# Patient Record
Sex: Male | Born: 1960 | Race: Black or African American | Hispanic: No | Marital: Single | Smoking: Current every day smoker
Health system: Southern US, Community
[De-identification: ages and names within clinical notes are randomized; demographics above are authoritative.]

## PROBLEM LIST (undated history)

## (undated) DIAGNOSIS — I10 Essential (primary) hypertension: Secondary | ICD-10-CM

## (undated) DIAGNOSIS — E119 Type 2 diabetes mellitus without complications: Secondary | ICD-10-CM

## (undated) HISTORY — PX: CERVICAL FUSION: SHX112

---

## 1997-12-30 ENCOUNTER — Emergency Department (HOSPITAL_COMMUNITY): Admission: EM | Admit: 1997-12-30 | Discharge: 1997-12-30 | Payer: Self-pay | Admitting: Emergency Medicine

## 1998-04-13 ENCOUNTER — Emergency Department (HOSPITAL_COMMUNITY): Admission: EM | Admit: 1998-04-13 | Discharge: 1998-04-13 | Payer: Self-pay | Admitting: Emergency Medicine

## 2001-02-22 ENCOUNTER — Emergency Department (HOSPITAL_COMMUNITY): Admission: EM | Admit: 2001-02-22 | Discharge: 2001-02-22 | Payer: Self-pay | Admitting: Emergency Medicine

## 2001-05-25 ENCOUNTER — Emergency Department (HOSPITAL_COMMUNITY): Admission: EM | Admit: 2001-05-25 | Discharge: 2001-05-25 | Payer: Self-pay | Admitting: *Deleted

## 2005-02-09 ENCOUNTER — Emergency Department: Payer: Self-pay | Admitting: Unknown Physician Specialty

## 2006-11-20 ENCOUNTER — Emergency Department: Payer: Self-pay | Admitting: Emergency Medicine

## 2009-03-06 ENCOUNTER — Emergency Department: Payer: Self-pay | Admitting: Emergency Medicine

## 2009-03-30 ENCOUNTER — Emergency Department: Payer: Self-pay | Admitting: Emergency Medicine

## 2010-04-19 ENCOUNTER — Emergency Department: Payer: Self-pay | Admitting: Emergency Medicine

## 2010-07-13 ENCOUNTER — Emergency Department: Payer: Self-pay | Admitting: Emergency Medicine

## 2013-12-14 ENCOUNTER — Emergency Department: Payer: Self-pay | Admitting: Emergency Medicine

## 2013-12-14 LAB — BASIC METABOLIC PANEL
Anion Gap: 6 — ABNORMAL LOW (ref 7–16)
BUN: 10 mg/dL (ref 7–18)
CHLORIDE: 102 mmol/L (ref 98–107)
Calcium, Total: 9.1 mg/dL (ref 8.5–10.1)
Co2: 28 mmol/L (ref 21–32)
Creatinine: 1.23 mg/dL (ref 0.60–1.30)
EGFR (African American): >60
EGFR (Non-African Amer.): >60
Glucose: 183 mg/dL — ABNORMAL HIGH (ref 65–99)
Osmolality: 276 (ref 275–301)
Potassium: 3.6 mmol/L (ref 3.5–5.1)
Sodium: 136 mmol/L (ref 136–145)

## 2013-12-14 LAB — CBC
HCT: 40.6 % (ref 40.0–52.0)
HGB: 13.1 g/dL (ref 13.0–18.0)
MCH: 26.2 pg (ref 26.0–34.0)
MCHC: 32.2 g/dL (ref 32.0–36.0)
MCV: 81 fL (ref 80–100)
PLATELETS: 214 10*3/uL (ref 150–440)
RBC: 4.99 10*6/uL (ref 4.40–5.90)
RDW: 13.5 % (ref 11.5–14.5)
WBC: 6.4 10*3/uL (ref 3.8–10.6)

## 2013-12-14 LAB — CK TOTAL AND CKMB (NOT AT ARMC)
CK, Total: 270 U/L
CK-MB: 2.6 ng/mL (ref 0.5–3.6)

## 2013-12-14 LAB — TROPONIN I

## 2016-09-09 ENCOUNTER — Emergency Department: Payer: No Typology Code available for payment source

## 2016-09-09 ENCOUNTER — Emergency Department
Admission: EM | Admit: 2016-09-09 | Discharge: 2016-09-09 | Disposition: A | Discharge status: Home / Self Care | Payer: No Typology Code available for payment source | Attending: Emergency Medicine | Admitting: Emergency Medicine

## 2016-09-09 ENCOUNTER — Encounter: Payer: Self-pay | Admitting: Emergency Medicine

## 2016-09-09 DIAGNOSIS — Y92410 Unspecified street and highway as the place of occurrence of the external cause: Secondary | ICD-10-CM | POA: Diagnosis not present

## 2016-09-09 DIAGNOSIS — Y999 Unspecified external cause status: Secondary | ICD-10-CM | POA: Diagnosis not present

## 2016-09-09 DIAGNOSIS — E119 Type 2 diabetes mellitus without complications: Secondary | ICD-10-CM | POA: Insufficient documentation

## 2016-09-09 DIAGNOSIS — S199XXA Unspecified injury of neck, initial encounter: Secondary | ICD-10-CM | POA: Diagnosis present

## 2016-09-09 DIAGNOSIS — F17200 Nicotine dependence, unspecified, uncomplicated: Secondary | ICD-10-CM | POA: Diagnosis not present

## 2016-09-09 DIAGNOSIS — Y939 Activity, unspecified: Secondary | ICD-10-CM | POA: Insufficient documentation

## 2016-09-09 DIAGNOSIS — I10 Essential (primary) hypertension: Secondary | ICD-10-CM | POA: Insufficient documentation

## 2016-09-09 DIAGNOSIS — S161XXA Strain of muscle, fascia and tendon at neck level, initial encounter: Secondary | ICD-10-CM | POA: Insufficient documentation

## 2016-09-09 HISTORY — DX: Type 2 diabetes mellitus without complications: E11.9

## 2016-09-09 HISTORY — DX: Essential (primary) hypertension: I10

## 2016-09-09 MED ORDER — ORPHENADRINE CITRATE 30 MG/ML IJ SOLN
60.0000 mg | Freq: Once | INTRAMUSCULAR | Status: AC
  Administered 2016-09-09: 60 mg via INTRAMUSCULAR
  Filled 2016-09-09: qty 2

## 2016-09-09 MED ORDER — KETOROLAC TROMETHAMINE 30 MG/ML IJ SOLN
30.0000 mg | Freq: Once | INTRAMUSCULAR | Status: AC
  Administered 2016-09-09: 30 mg via INTRAMUSCULAR
  Filled 2016-09-09: qty 1

## 2016-09-09 MED ORDER — CYCLOBENZAPRINE HCL 10 MG PO TABS: 10.0000 mg | ORAL_TABLET | Freq: Three times a day (TID) | ORAL | 0 refills | Status: DC | PRN

## 2016-09-09 MED ORDER — MELOXICAM 15 MG PO TABS: 15.0000 mg | ORAL_TABLET | Freq: Every day | ORAL | 0 refills | Status: DC
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
## 2016-11-04 ENCOUNTER — Emergency Department
Admission: EM | Admit: 2016-11-04 | Discharge: 2016-11-04 | Disposition: A | Discharge status: Home / Self Care | Payer: No Typology Code available for payment source | Attending: Emergency Medicine | Admitting: Emergency Medicine

## 2016-11-04 ENCOUNTER — Encounter: Payer: Self-pay | Admitting: Emergency Medicine

## 2016-11-04 DIAGNOSIS — I10 Essential (primary) hypertension: Secondary | ICD-10-CM | POA: Insufficient documentation

## 2016-11-04 DIAGNOSIS — E119 Type 2 diabetes mellitus without complications: Secondary | ICD-10-CM | POA: Diagnosis not present

## 2016-11-04 DIAGNOSIS — F17200 Nicotine dependence, unspecified, uncomplicated: Secondary | ICD-10-CM | POA: Insufficient documentation

## 2016-11-04 DIAGNOSIS — S139XXD Sprain of joints and ligaments of unspecified parts of neck, subsequent encounter: Secondary | ICD-10-CM | POA: Diagnosis not present

## 2016-11-04 DIAGNOSIS — S199XXD Unspecified injury of neck, subsequent encounter: Secondary | ICD-10-CM | POA: Diagnosis present

## 2016-11-04 DIAGNOSIS — S139XXA Sprain of joints and ligaments of unspecified parts of neck, initial encounter: Secondary | ICD-10-CM

## 2016-11-04 DIAGNOSIS — Z79899 Other long term (current) drug therapy: Secondary | ICD-10-CM | POA: Diagnosis not present

## 2016-11-04 MED ORDER — NAPROXEN 500 MG PO TABS
500.0000 mg | ORAL_TABLET | Freq: Once | ORAL | Status: AC
  Administered 2016-11-04: 500 mg via ORAL
  Filled 2016-11-04: qty 1

## 2016-11-04 MED ORDER — NAPROXEN 500 MG PO TABS: 500.0000 mg | ORAL_TABLET | Freq: Two times a day (BID) | ORAL | 0 refills | Status: DC

## 2016-11-04 MED ORDER — CYCLOBENZAPRINE HCL 10 MG PO TABS: 10.0000 mg | ORAL_TABLET | Freq: Three times a day (TID) | ORAL | 0 refills | Status: DC | PRN

## 2016-11-04 MED ORDER — CYCLOBENZAPRINE HCL 10 MG PO TABS
10.0000 mg | ORAL_TABLET | Freq: Once | ORAL | Status: AC
  Administered 2016-11-04: 10 mg via ORAL
  Filled 2016-11-04: qty 1
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
## 2017-01-07 ENCOUNTER — Emergency Department
Admission: EM | Admit: 2017-01-07 | Discharge: 2017-01-08 | Disposition: A | Discharge status: Home / Self Care | Payer: No Typology Code available for payment source | Attending: Emergency Medicine | Admitting: Emergency Medicine

## 2017-01-07 ENCOUNTER — Encounter: Payer: Self-pay | Admitting: Emergency Medicine

## 2017-01-07 DIAGNOSIS — Z79899 Other long term (current) drug therapy: Secondary | ICD-10-CM | POA: Diagnosis not present

## 2017-01-07 DIAGNOSIS — M4802 Spinal stenosis, cervical region: Secondary | ICD-10-CM | POA: Diagnosis not present

## 2017-01-07 DIAGNOSIS — E119 Type 2 diabetes mellitus without complications: Secondary | ICD-10-CM | POA: Diagnosis not present

## 2017-01-07 DIAGNOSIS — M542 Cervicalgia: Secondary | ICD-10-CM | POA: Diagnosis not present

## 2017-01-07 DIAGNOSIS — I10 Essential (primary) hypertension: Secondary | ICD-10-CM | POA: Insufficient documentation

## 2017-01-07 DIAGNOSIS — G8929 Other chronic pain: Secondary | ICD-10-CM

## 2017-01-07 DIAGNOSIS — F17200 Nicotine dependence, unspecified, uncomplicated: Secondary | ICD-10-CM | POA: Diagnosis not present

## 2017-01-07 MED ORDER — HYDROCODONE-ACETAMINOPHEN 5-325 MG PO TABS
1.0000 | ORAL_TABLET | Freq: Once | ORAL | Status: AC
  Administered 2017-01-07: 1 via ORAL
  Filled 2017-01-07: qty 1

## 2017-01-07 MED ORDER — CYCLOBENZAPRINE HCL 5 MG PO TABS: 5.0000 mg | ORAL_TABLET | Freq: Three times a day (TID) | ORAL | 0 refills | Status: DC | PRN

## 2017-01-07 MED ORDER — DICLOFENAC SODIUM 75 MG PO TBEC: 75.0000 mg | DELAYED_RELEASE_TABLET | Freq: Two times a day (BID) | ORAL | 1 refills | Status: DC

## 2017-01-07 MED ORDER — NAPROXEN 500 MG PO TABS
500.0000 mg | ORAL_TABLET | Freq: Once | ORAL | Status: AC
  Administered 2017-01-07: 500 mg via ORAL
  Filled 2017-01-07: qty 1
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
## 2017-01-07 NOTE — ED Provider Notes (Signed)
[facility] Emergency Department Provider Note ____________________________________________  Time seen: 2334  I have reviewed the triage vital signs and the nursing notes.  HISTORY  Chief Complaint  Neck Pain  HPI Arthur Huff is a [age] y.o. male presents to the ED for evaluation of chronic neck pain. [facility] and[facility] have multi-level DDD, osteophytes, and canal stenosis. He is not currently on any medication, and reports neck pain and stiffness, without injury. He denies any distal paresthesias or grip changes. He does note some muscle pain in the left upper trapezius region.   Past Medical History:  Diagnosis Date  . Diabetes mellitus without complication (HCC)   . Hypertension     There are no active problems to display for this patient.  History reviewed. No pertinent surgical history.  Prior to Admission medications   Medication Sig Start Date End Date Taking? Authorizing Provider  cyclobenzaprine (FLEXERIL) 5 MG tablet Take 1 tablet (5 mg total) by mouth 3 (three) times daily as needed for muscle spasms. 01/07/17   Linard Daft, Charlesetta IvoryJenise V Bacon, PA-C  diclofenac (VOLTAREN) 75 MG EC tablet Take 1 tablet (75 mg total) by mouth 2 (two) times daily. 01/07/17   Muscab Brenneman, Charlesetta IvoryJenise V Bacon, PA-C  meloxicam (MOBIC) 15 MG tablet Take 1 tablet (15 mg total) by mouth daily. 09/09/16   Cuthriell, Delorise RoyalsJonathan D, PA-C  naproxen (NAPROSYN) 500 MG tablet Take 1 tablet (500 mg total) by mouth 2 (two) times daily with a meal. 11/04/16   Triplett, Cari B, FNP   Allergies Patient has no known allergies.  No family history on file.  Social History Social History  Substance Use Topics  . Smoking status: Current Every Day Smoker  . Smokeless tobacco: Never Used  . Alcohol use Yes    Review of Systems  Constitutional: Negative for fever. Cardiovascular: Negative for chest pain. Respiratory: Negative for shortness of breath. Musculoskeletal: Negative for back  pain. Reports neck pain as above Skin: Negative for rash. Neurological: Negative for headaches, focal weakness or numbness. ____________________________________________  PHYSICAL EXAM:  VITAL SIGNS: ED Triage Vitals  Enc Vitals Group     BP 01/07/17 2219 (!) 149/86     Pulse Rate 01/07/17 2219 82     Resp 01/07/17 2219 18     Temp 01/07/17 2219 98.4 F (36.9 C)     Temp Source 01/07/17 2219 Oral     SpO2 01/07/17 2219 98 %     Weight 01/07/17 2218 170 lb (77.1 kg)     Height 01/07/17 2218 6\' 2"  (1.88 m)     Head Circumference --      Peak Flow --      Pain Score 01/07/17 2218 7     Pain Loc --      Pain Edu? --      Excl. in GC? --     Constitutional: Alert and oriented. Well appearing and in no distress. Head: Normocephalic and atraumatic. Neck: Supple. No thyromegaly. Normal range without crepitus.  Cardiovascular: Normal rate, regular rhythm. Normal distal pulses. Respiratory: Normal respiratory effort. No wheezes/rales/rhonchi. Musculoskeletal: Nontender with normal range of motion in all extremities.  Neurologic: CN II-XII grossly intact. Normal UE DTRs bilaterally. Normal gait without ataxia. Normal speech and language. No gross focal neurologic deficits are appreciated. Skin:  Skin is warm, dry and intact. No rash noted. ____________________________________________  PROCEDURES  Naproxen 500 mg PO Norco 5-325 mg PO ____________________________________________  INITIAL IMPRESSION / ASSESSMENT AND  PLAN / ED COURSE  Patient with ED evaluation of chronic neck pain due to underlying DDD and spinal stenosis. He is discharged with a prescription for Voltaren as well as Flexeril. He will follow up with his primary care provider at [facility] or [facility] health services for ongoing management. ____________________________________________  FINAL CLINICAL IMPRESSION(S) / ED DIAGNOSES  Final diagnoses:  Chronic neck pain  Spinal stenosis in cervical region      Lissa Hoard, PA-C 01/07/17 2359    Phineas Semen, MD 01/08/17 1534
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
## 2017-01-08 NOTE — ED Notes (Signed)
Patient verbalizes understanding of d/c instructions and follow-up. VS stable and pain controlled per patient.  Patient in NAD at time of d/c and denies further concerns regarding this visit. Patient stable at the time of departure from the unit, departing unit by the safest and most appropriate manner per that patients condition and limitations. Patient advised to return to the ED at any time for emergent concerns, or for new/worsening symptoms.   

## 2017-01-08 NOTE — Discharge Instructions (Signed)
Your exam and previous MRIs shows stenosis and degenerative disc disease. Take the prescription meds as directed. Follow[facility] with [facility] as needed.

## 2017-11-03 ENCOUNTER — Encounter: Payer: Self-pay | Admitting: Emergency Medicine

## 2017-11-03 ENCOUNTER — Other Ambulatory Visit: Payer: Self-pay

## 2017-11-03 ENCOUNTER — Emergency Department
Admission: EM | Admit: 2017-11-03 | Discharge: 2017-11-03 | Disposition: A | Discharge status: Home / Self Care | Payer: No Typology Code available for payment source | Attending: Emergency Medicine | Admitting: Emergency Medicine

## 2017-11-03 DIAGNOSIS — S161XXA Strain of muscle, fascia and tendon at neck level, initial encounter: Secondary | ICD-10-CM | POA: Insufficient documentation

## 2017-11-03 DIAGNOSIS — F17200 Nicotine dependence, unspecified, uncomplicated: Secondary | ICD-10-CM | POA: Insufficient documentation

## 2017-11-03 DIAGNOSIS — Y999 Unspecified external cause status: Secondary | ICD-10-CM | POA: Insufficient documentation

## 2017-11-03 DIAGNOSIS — Y92410 Unspecified street and highway as the place of occurrence of the external cause: Secondary | ICD-10-CM | POA: Insufficient documentation

## 2017-11-03 DIAGNOSIS — E119 Type 2 diabetes mellitus without complications: Secondary | ICD-10-CM | POA: Insufficient documentation

## 2017-11-03 DIAGNOSIS — I10 Essential (primary) hypertension: Secondary | ICD-10-CM | POA: Diagnosis not present

## 2017-11-03 DIAGNOSIS — Z79899 Other long term (current) drug therapy: Secondary | ICD-10-CM | POA: Diagnosis not present

## 2017-11-03 DIAGNOSIS — S199XXA Unspecified injury of neck, initial encounter: Secondary | ICD-10-CM | POA: Diagnosis present

## 2017-11-03 DIAGNOSIS — Y9389 Activity, other specified: Secondary | ICD-10-CM | POA: Diagnosis not present

## 2017-11-03 MED ORDER — MELOXICAM 15 MG PO TABS: 15.0000 mg | ORAL_TABLET | Freq: Every day | ORAL | 2 refills | Status: AC

## 2017-11-03 MED ORDER — BACLOFEN 10 MG PO TABS: 10.0000 mg | ORAL_TABLET | Freq: Every day | ORAL | 1 refills | Status: AC
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
## 2017-11-03 NOTE — Discharge Instructions (Addendum)
Follow-up with your regular doctor if not better in 5 to 7 days.  Or you may follow-up with orthopedics.  Dr. Hyacinth MeekerMiller is on call call his office for an appointment if needed.  Use ice on the areas that hurt.  In 3 days she can switch to wet heat followed by ice.  Be up and active as this will decrease the soreness.  Take medication as prescribed.  Return to emergency department if worsening.
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
## 2021-04-25 ENCOUNTER — Other Ambulatory Visit: Payer: Self-pay

## 2021-04-25 ENCOUNTER — Emergency Department: Payer: No Typology Code available for payment source

## 2021-04-25 ENCOUNTER — Encounter: Payer: Self-pay | Admitting: Emergency Medicine

## 2021-04-25 ENCOUNTER — Emergency Department
Admission: EM | Admit: 2021-04-25 | Discharge: 2021-04-25 | Disposition: A | Discharge status: Home / Self Care | Payer: No Typology Code available for payment source | Attending: Emergency Medicine | Admitting: Emergency Medicine

## 2021-04-25 DIAGNOSIS — I10 Essential (primary) hypertension: Secondary | ICD-10-CM | POA: Insufficient documentation

## 2021-04-25 DIAGNOSIS — Z7984 Long term (current) use of oral hypoglycemic drugs: Secondary | ICD-10-CM | POA: Diagnosis not present

## 2021-04-25 DIAGNOSIS — Y92410 Unspecified street and highway as the place of occurrence of the external cause: Secondary | ICD-10-CM | POA: Insufficient documentation

## 2021-04-25 DIAGNOSIS — E119 Type 2 diabetes mellitus without complications: Secondary | ICD-10-CM | POA: Diagnosis not present

## 2021-04-25 DIAGNOSIS — S161XXA Strain of muscle, fascia and tendon at neck level, initial encounter: Secondary | ICD-10-CM | POA: Insufficient documentation

## 2021-04-25 DIAGNOSIS — M546 Pain in thoracic spine: Secondary | ICD-10-CM | POA: Diagnosis not present

## 2021-04-25 DIAGNOSIS — Z79899 Other long term (current) drug therapy: Secondary | ICD-10-CM | POA: Diagnosis not present

## 2021-04-25 DIAGNOSIS — F17200 Nicotine dependence, unspecified, uncomplicated: Secondary | ICD-10-CM | POA: Insufficient documentation

## 2021-04-25 DIAGNOSIS — S199XXA Unspecified injury of neck, initial encounter: Secondary | ICD-10-CM | POA: Diagnosis present

## 2021-04-25 MED ORDER — KETOROLAC TROMETHAMINE 30 MG/ML IJ SOLN
15.0000 mg | Freq: Once | INTRAMUSCULAR | Status: AC
  Administered 2021-04-25: 15 mg via INTRAMUSCULAR
  Filled 2021-04-25: qty 1

## 2021-04-25 MED ORDER — CYCLOBENZAPRINE HCL 5 MG PO TABS: ORAL_TABLET | ORAL | 0 refills | Status: AC

## 2021-04-25 MED ORDER — NAPROXEN 500 MG PO TABS: 500.0000 mg | ORAL_TABLET | Freq: Two times a day (BID) | ORAL | 0 refills | Status: AC
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
## 2021-04-25 NOTE — Discharge Instructions (Signed)
1.  You may take medicines as needed for pain and muscle spasms (Naprosyn/Flexeril). 2.  Apply moist heat to affected area several times daily. 3.  Return to the ER for worsening symptoms,
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
## 2021-10-06 ENCOUNTER — Emergency Department: Payer: Self-pay

## 2021-10-06 ENCOUNTER — Other Ambulatory Visit: Payer: Self-pay

## 2021-10-06 ENCOUNTER — Emergency Department
Admission: EM | Admit: 2021-10-06 | Discharge: 2021-10-07 | Disposition: A | Discharge status: Home / Self Care | Payer: Self-pay | Attending: Emergency Medicine | Admitting: Emergency Medicine

## 2021-10-06 DIAGNOSIS — Z7984 Long term (current) use of oral hypoglycemic drugs: Secondary | ICD-10-CM | POA: Diagnosis not present

## 2021-10-06 DIAGNOSIS — M542 Cervicalgia: Secondary | ICD-10-CM | POA: Insufficient documentation

## 2021-10-06 DIAGNOSIS — F1490 Cocaine use, unspecified, uncomplicated: Secondary | ICD-10-CM | POA: Diagnosis not present

## 2021-10-06 DIAGNOSIS — I10 Essential (primary) hypertension: Secondary | ICD-10-CM | POA: Diagnosis not present

## 2021-10-06 DIAGNOSIS — M25511 Pain in right shoulder: Secondary | ICD-10-CM | POA: Diagnosis not present

## 2021-10-06 DIAGNOSIS — E1165 Type 2 diabetes mellitus with hyperglycemia: Secondary | ICD-10-CM | POA: Insufficient documentation

## 2021-10-06 DIAGNOSIS — R410 Disorientation, unspecified: Secondary | ICD-10-CM | POA: Insufficient documentation

## 2021-10-06 DIAGNOSIS — R739 Hyperglycemia, unspecified: Secondary | ICD-10-CM

## 2021-10-06 DIAGNOSIS — Z79899 Other long term (current) drug therapy: Secondary | ICD-10-CM | POA: Diagnosis not present

## 2021-10-06 DIAGNOSIS — Y92410 Unspecified street and highway as the place of occurrence of the external cause: Secondary | ICD-10-CM | POA: Diagnosis not present

## 2021-10-06 LAB — CBG MONITORING, ED: Glucose-Capillary: >600 mg/dL (ref 70–99)

## 2021-10-06 MED ORDER — KETOROLAC TROMETHAMINE 30 MG/ML IJ SOLN
30.0000 mg | Freq: Once | INTRAMUSCULAR | Status: AC
  Administered 2021-10-07: 30 mg via INTRAVENOUS
  Filled 2021-10-06: qty 1

## 2021-10-06 MED ORDER — SODIUM CHLORIDE 0.9 % IV BOLUS (SEPSIS)
1000.0000 mL | Freq: Once | INTRAVENOUS | Status: AC
  Administered 2021-10-07: 1000 mL via INTRAVENOUS

## 2021-10-06 NOTE — ED Notes (Signed)
UA SENT TO LAB .  
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
## 2021-10-07 ENCOUNTER — Emergency Department: Payer: Self-pay

## 2021-10-07 LAB — BLOOD GAS, VENOUS
Acid-Base Excess: 3.1 mmol/L — ABNORMAL HIGH (ref 0.0–2.0)
Bicarbonate: 29.5 mmol/L — ABNORMAL HIGH (ref 20.0–28.0)
O2 Saturation: 86.1 %
Patient temperature: 37
pCO2, Ven: 51 mmHg (ref 44–60)
pH, Ven: 7.37 (ref 7.25–7.43)
pO2, Ven: 54 mmHg — ABNORMAL HIGH (ref 32–45)

## 2021-10-07 LAB — COMPREHENSIVE METABOLIC PANEL
ALT: 17 U/L (ref 0–44)
AST: 23 U/L (ref 15–41)
Albumin: 3.5 g/dL (ref 3.5–5.0)
Alkaline Phosphatase: 67 U/L (ref 38–126)
Anion gap: 8 (ref 5–15)
BUN: 17 mg/dL (ref 8–23)
CO2: 28 mmol/L (ref 22–32)
Calcium: 8.8 mg/dL — ABNORMAL LOW (ref 8.9–10.3)
Chloride: 99 mmol/L (ref 98–111)
Creatinine, Ser: 1.5 mg/dL — ABNORMAL HIGH (ref 0.61–1.24)
GFR, Estimated: 53 mL/min — ABNORMAL LOW (ref >60)
Glucose, Bld: 660 mg/dL (ref 70–99)
Potassium: 4 mmol/L (ref 3.5–5.1)
Sodium: 135 mmol/L (ref 135–145)
Total Bilirubin: 0.5 mg/dL (ref 0.3–1.2)
Total Protein: 6.6 g/dL (ref 6.5–8.1)

## 2021-10-07 LAB — URINE DRUG SCREEN, QUALITATIVE (ARMC ONLY)
Amphetamines, Ur Screen: NOT DETECTED
Barbiturates, Ur Screen: NOT DETECTED
Benzodiazepine, Ur Scrn: NOT DETECTED
Cannabinoid 50 Ng, Ur ~~LOC~~: POSITIVE — AB
Cocaine Metabolite,Ur ~~LOC~~: POSITIVE — AB
MDMA (Ecstasy)Ur Screen: NOT DETECTED
Methadone Scn, Ur: NOT DETECTED
Opiate, Ur Screen: NOT DETECTED
Phencyclidine (PCP) Ur S: NOT DETECTED
Tricyclic, Ur Screen: NOT DETECTED

## 2021-10-07 LAB — CBG MONITORING, ED
Glucose-Capillary: 372 mg/dL — ABNORMAL HIGH (ref 70–99)
Glucose-Capillary: 309 mg/dL — ABNORMAL HIGH (ref 70–99)

## 2021-10-07 LAB — CBC WITH DIFFERENTIAL/PLATELET
Abs Immature Granulocytes: 0.01 10*3/uL (ref 0.00–0.07)
Basophils Absolute: 0.1 10*3/uL (ref 0.0–0.1)
Basophils Relative: 1 %
Eosinophils Absolute: 0.2 10*3/uL (ref 0.0–0.5)
Eosinophils Relative: 3 %
HCT: 31.9 % — ABNORMAL LOW (ref 39.0–52.0)
Hemoglobin: 10.2 g/dL — ABNORMAL LOW (ref 13.0–17.0)
Immature Granulocytes: 0 %
Lymphocytes Relative: 26 %
Lymphs Abs: 1.6 10*3/uL (ref 0.7–4.0)
MCH: 25.4 pg — ABNORMAL LOW (ref 26.0–34.0)
MCHC: 32 g/dL (ref 30.0–36.0)
MCV: 79.6 fL — ABNORMAL LOW (ref 80.0–100.0)
Monocytes Absolute: 0.4 10*3/uL (ref 0.1–1.0)
Monocytes Relative: 7 %
Neutro Abs: 3.9 10*3/uL (ref 1.7–7.7)
Neutrophils Relative %: 63 %
Platelets: 218 10*3/uL (ref 150–400)
RBC: 4.01 MIL/uL — ABNORMAL LOW (ref 4.22–5.81)
RDW: 12.1 % (ref 11.5–15.5)
WBC: 6.2 10*3/uL (ref 4.0–10.5)
nRBC: 0 % (ref 0.0–0.2)

## 2021-10-07 LAB — URINALYSIS, ROUTINE W REFLEX MICROSCOPIC
Bacteria, UA: NONE SEEN
Bilirubin Urine: NEGATIVE
Glucose, UA: >=500 mg/dL — AB
Hgb urine dipstick: NEGATIVE
Ketones, ur: NEGATIVE mg/dL
Leukocytes,Ua: NEGATIVE
Nitrite: NEGATIVE
Protein, ur: NEGATIVE mg/dL
Specific Gravity, Urine: 1.025 (ref 1.005–1.030)
Squamous Epithelial / LPF: NONE SEEN (ref 0–5)
WBC, UA: NONE SEEN WBC/hpf (ref 0–5)
pH: 7 (ref 5.0–8.0)

## 2021-10-07 LAB — ETHANOL: Alcohol, Ethyl (B): <10 mg/dL (ref <10)

## 2021-10-07 MED ORDER — SODIUM CHLORIDE 0.9 % IV BOLUS (SEPSIS)
1000.0000 mL | Freq: Once | INTRAVENOUS | Status: AC
  Administered 2021-10-07: 1000 mL via INTRAVENOUS

## 2021-10-07 MED ORDER — KETOROLAC TROMETHAMINE 30 MG/ML IJ SOLN
30.0000 mg | Freq: Once | INTRAMUSCULAR | Status: AC
  Administered 2021-10-07: 30 mg via INTRAVENOUS
  Filled 2021-10-07: qty 1

## 2021-10-07 MED ORDER — INSULIN ASPART 100 UNIT/ML IJ SOLN
10.0000 [IU] | Freq: Once | INTRAMUSCULAR | Status: AC
  Administered 2021-10-07: 10 [IU] via INTRAVENOUS
  Filled 2021-10-07: qty 1

## 2021-10-07 MED ORDER — INSULIN ASPART 100 UNIT/ML IJ SOLN
5.0000 [IU] | Freq: Once | INTRAMUSCULAR | Status: AC
  Administered 2021-10-07: 5 [IU] via INTRAVENOUS
  Filled 2021-10-07: qty 1

## 2021-10-07 NOTE — ED Notes (Signed)
This tech attempted to provide pt with a new dry linen change. Pt refused, pt stated, "No I don't want you to change them. I'm comfortable right now." CBG obtained, and urinal emptied. Romelle Starcher, RN aware.  ?

## 2022-12-25 ENCOUNTER — Emergency Department
Admission: EM | Admit: 2022-12-25 | Discharge: 2022-12-25 | Disposition: A | Discharge status: Home / Self Care | Payer: Medicaid Other | Attending: Emergency Medicine | Admitting: Emergency Medicine

## 2022-12-25 ENCOUNTER — Other Ambulatory Visit: Payer: Self-pay

## 2022-12-25 DIAGNOSIS — E119 Type 2 diabetes mellitus without complications: Secondary | ICD-10-CM | POA: Insufficient documentation

## 2022-12-25 DIAGNOSIS — R0789 Other chest pain: Secondary | ICD-10-CM | POA: Insufficient documentation

## 2022-12-25 DIAGNOSIS — R000 Tachycardia, unspecified: Secondary | ICD-10-CM | POA: Insufficient documentation

## 2022-12-25 DIAGNOSIS — R079 Chest pain, unspecified: Secondary | ICD-10-CM

## 2022-12-25 DIAGNOSIS — I10 Essential (primary) hypertension: Secondary | ICD-10-CM | POA: Insufficient documentation

## 2022-12-25 LAB — CBC
HCT: 36.6 % — ABNORMAL LOW (ref 39.0–52.0)
Hemoglobin: 11.8 g/dL — ABNORMAL LOW (ref 13.0–17.0)
MCH: 24.6 pg — ABNORMAL LOW (ref 26.0–34.0)
MCHC: 32.2 g/dL (ref 30.0–36.0)
MCV: 76.4 fL — ABNORMAL LOW (ref 80.0–100.0)
Platelets: 221 10*3/uL (ref 150–400)
RBC: 4.79 MIL/uL (ref 4.22–5.81)
RDW: 15.3 % (ref 11.5–15.5)
WBC: 6.5 10*3/uL (ref 4.0–10.5)
nRBC: 0 % (ref 0.0–0.2)

## 2022-12-25 LAB — BASIC METABOLIC PANEL
Anion gap: 11 (ref 5–15)
BUN: 17 mg/dL (ref 8–23)
CO2: 25 mmol/L (ref 22–32)
Calcium: 9.1 mg/dL (ref 8.9–10.3)
Chloride: 102 mmol/L (ref 98–111)
Creatinine, Ser: 0.93 mg/dL (ref 0.61–1.24)
GFR, Estimated: >60 mL/min (ref >60)
Glucose, Bld: 98 mg/dL (ref 70–99)
Potassium: 3.3 mmol/L — ABNORMAL LOW (ref 3.5–5.1)
Sodium: 138 mmol/L (ref 135–145)

## 2022-12-25 LAB — TROPONIN I (HIGH SENSITIVITY): Troponin I (High Sensitivity): 3 ng/L (ref <18)

## 2022-12-25 NOTE — ED Provider Notes (Signed)
[facility] Provider Note    Event Date/Time   First MD Initiated Contact with Patient 12/25/22 1104     (approximate)  History   Chief Complaint: No chief complaint on file.  HPI  Arthur Huff is a 62 y.o. male with a past medical history of diabetes, hypertension, presents to the emergency department for chest pain and not feeling well.  According to the patient he has been experiencing increasing shoulder pain which is a chronic condition per patient he is scheduled to have a surgery coming up.  He states a friend of his gave him a pill that he was told would help with the pain.  He took a pill this morning and shortly afterwards began feeling unwell.  Patient states he was feeling somewhat lightheaded and was having some pain in his chest.  Patient states he was lifting firewood when the pain started so he is not sure if it was the muscles or his heart.  States he went inside and checked his blood pressure and it was elevated he was worried so he came to the emergency department for evaluation.  Since arriving to the emergency department however the patient states he is feeling back to normal feels like the medicine is worn off.  Physical Exam   Triage Vital Signs: ED Triage Vitals  Encounter Vitals Group     BP 12/25/22 0944 (!) 147/121     Systolic BP Percentile --      Diastolic BP Percentile --      Pulse Rate 12/25/22 0944 (!) 111     Resp 12/25/22 0944 20     Temp 12/25/22 0944 97.6 F (36.4 C)     Temp src --      SpO2 12/25/22 0944 98 %     Weight 12/25/22 0945 148 lb (67.1 kg)     Height 12/25/22 0945 6\' 1"  (1.854 m)     Head Circumference --      Peak Flow --      Pain Score 12/25/22 0945 5     Pain Loc --      Pain Education --      Exclude from Growth Chart --     Most recent vital signs: Vitals:   12/25/22 0944  BP: (!) 147/121  Pulse: (!) 111  Resp: 20  Temp: 97.6 F (36.4 C)  SpO2: 98%    General: Awake, no distress.   CV:  Good peripheral perfusion.  Regular rate and rhythm  Resp:  Normal effort.  Equal breath sounds bilaterally.  Mild left chest wall tenderness to palpation over the pectoralis muscle. Abd:  No distention.  Soft, nontender.  No rebound or guarding.   ED Results / Procedures / Treatments   EKG  EKG viewed and interpreted by myself shows sinus tachycardia at 106 bpm with a narrow QRS, normal axis, normal intervals, no concerning ST changes  MEDICATIONS ORDERED IN ED: Medications - No data to display   IMPRESSION / MDM / ASSESSMENT AND PLAN / ED COURSE  I reviewed the triage vital signs and the nursing notes.  Patient's presentation is most consistent with acute presentation with potential threat to life or bodily function.  Patient presents to the emergency department for chest pain not feeling well, high blood pressure after taking an unknown pill.  Patient states he took this pill around 8:00 or so this morning and within 30 minutes began feeling unwell.  He does not know what  the pill was he was just told by his friend that it would help the pain.  Patient states his big concern was checking of blood pressure at home and it was elevated so he came to the emergency department for evaluation.  Overall the patient appears well, no distress does have mild chest wall tenderness on the left side but the patient states he has been lifting wood all day.  Patient's chemistry is reassuring, CBC is reassuring and the patient's troponin is negative.  EKG shows no concerning findings.  Patient states he is feeling back to normal now he believes the medication is worn off.  Given the patient's reassuring physical exam and reassuring workup I believe the patient would safe for discharge home with PCP follow-up.  Patient agreeable to plan of care.  FINAL CLINICAL IMPRESSION(S) / ED DIAGNOSES   Chest pain Chest wall pain  Note:  This document was prepared using Dragon voice recognition software and  may include unintentional dictation errors.   Minna Antis, MD 12/25/22 1120

## 2022-12-25 NOTE — ED Triage Notes (Signed)
Pt to ED [facility] for evaluation. Took unknown pill for shoulder pain (having surgery soon). States feels funny in chest, hypertensive. No hx of HTN.  Takes percocet with pain clinic

## 2022-12-25 NOTE — ED Notes (Signed)
Pt. Brought cordless phone.

## 2023-01-24 ENCOUNTER — Other Ambulatory Visit: Payer: Self-pay

## 2023-01-24 ENCOUNTER — Emergency Department
Admission: EM | Admit: 2023-01-24 | Discharge: 2023-01-24 | Disposition: A | Discharge status: Home / Self Care | Payer: Medicaid Other | Attending: Emergency Medicine | Admitting: Emergency Medicine

## 2023-01-24 DIAGNOSIS — E119 Type 2 diabetes mellitus without complications: Secondary | ICD-10-CM | POA: Insufficient documentation

## 2023-01-24 DIAGNOSIS — I10 Essential (primary) hypertension: Secondary | ICD-10-CM | POA: Insufficient documentation

## 2023-01-24 DIAGNOSIS — R000 Tachycardia, unspecified: Secondary | ICD-10-CM | POA: Insufficient documentation

## 2023-01-24 DIAGNOSIS — R251 Tremor, unspecified: Secondary | ICD-10-CM | POA: Diagnosis not present

## 2023-01-24 DIAGNOSIS — E860 Dehydration: Secondary | ICD-10-CM | POA: Diagnosis not present

## 2023-01-24 LAB — CBC WITH DIFFERENTIAL/PLATELET
Abs Immature Granulocytes: 0.04 10*3/uL (ref 0.00–0.07)
Basophils Absolute: 0.1 10*3/uL (ref 0.0–0.1)
Basophils Relative: 1 %
Eosinophils Absolute: 0.1 10*3/uL (ref 0.0–0.5)
Eosinophils Relative: 0 %
HCT: 38.5 % — ABNORMAL LOW (ref 39.0–52.0)
Hemoglobin: 12.1 g/dL — ABNORMAL LOW (ref 13.0–17.0)
Immature Granulocytes: 0 %
Lymphocytes Relative: 11 %
Lymphs Abs: 1.5 10*3/uL (ref 0.7–4.0)
MCH: 24.7 pg — ABNORMAL LOW (ref 26.0–34.0)
MCHC: 31.4 g/dL (ref 30.0–36.0)
MCV: 78.6 fL — ABNORMAL LOW (ref 80.0–100.0)
Monocytes Absolute: 0.7 10*3/uL (ref 0.1–1.0)
Monocytes Relative: 5 %
Neutro Abs: 11.1 10*3/uL — ABNORMAL HIGH (ref 1.7–7.7)
Neutrophils Relative %: 83 %
Platelets: 415 10*3/uL — ABNORMAL HIGH (ref 150–400)
RBC: 4.9 MIL/uL (ref 4.22–5.81)
RDW: 14.2 % (ref 11.5–15.5)
WBC: 13.5 10*3/uL — ABNORMAL HIGH (ref 4.0–10.5)
nRBC: 0 % (ref 0.0–0.2)

## 2023-01-24 LAB — COMPREHENSIVE METABOLIC PANEL WITH GFR
ALT: 12 U/L (ref 0–44)
AST: 20 U/L (ref 15–41)
Albumin: 4.3 g/dL (ref 3.5–5.0)
Alkaline Phosphatase: 63 U/L (ref 38–126)
Anion gap: 12 (ref 5–15)
BUN: 16 mg/dL (ref 8–23)
CO2: 25 mmol/L (ref 22–32)
Calcium: 8.9 mg/dL (ref 8.9–10.3)
Chloride: 96 mmol/L — ABNORMAL LOW (ref 98–111)
Creatinine, Ser: 0.98 mg/dL (ref 0.61–1.24)
GFR, Estimated: >60 mL/min
Glucose, Bld: 151 mg/dL — ABNORMAL HIGH (ref 70–99)
Potassium: 3.5 mmol/L (ref 3.5–5.1)
Sodium: 133 mmol/L — ABNORMAL LOW (ref 135–145)
Total Bilirubin: 0.1 mg/dL — ABNORMAL LOW (ref 0.3–1.2)
Total Protein: 9.2 g/dL — ABNORMAL HIGH (ref 6.5–8.1)

## 2023-01-24 LAB — CBG MONITORING, ED: Glucose-Capillary: 152 mg/dL — ABNORMAL HIGH (ref 70–99)

## 2023-01-24 MED ORDER — SODIUM CHLORIDE 0.9 % IV BOLUS
1000.0000 mL | Freq: Once | INTRAVENOUS | Status: AC
  Administered 2023-01-24: 1000 mL via INTRAVENOUS

## 2023-01-24 NOTE — ED Triage Notes (Signed)
Arrives from home via [facility].  Recent ?neck surgery. Today, c/o hand tremors.  EMS notes HTN.  HTN had been uncercontrol without meds x 5 years.    Patient is AAOx3.  Skin warm and dry. NAD. No trmors appreciated by EMS.  P:  117 BP: 194/125 CBG:  178

## 2023-01-24 NOTE — ED Provider Notes (Signed)
[facility] Provider Note    Event Date/Time   First MD Initiated Contact with Patient 01/24/23 1543     (approximate)   History   Hypertension   HPI  Arthur Huff is a 62 y.o. male with a history of diabetes and hypertension who presents with arm tremor and concern for elevated blood pressure.  The patient states that he had neck surgery last week.  He has been recovering well.  He started to have some increased pain in the neck today and took some BC powder which he states helped.  He is no longer having any pain currently.  However he started to have some tremor in his arms bilaterally.  He thought it may have been due to his nerves.  He states that he has no numbness or tingling in the arms and no weakness.  He denies any acute or worsening pain in the neck.  He states that the wound to his neck is healing well.  He has no chest pain or difficulty breathing.  He denies any palpitations.  However he states he thinks he may be dehydrated.  He states that after the surgery he has not really been drinking enough liquids.  I reviewed the past medical records.  The patient was seen in the ED on 7/31 with chest pain and not feeling well after taking an unknown medication.  Workup was negative at that time.  He subsequently had an admission at [facility] for cervical myelopathy between 8/19 and 8/22 during which he had a C3-6 discectomy and fusion.    Physical Exam   Triage Vital Signs: ED Triage Vitals  Encounter Vitals Group     BP 01/24/23 1447 (!) 161/111     Systolic BP Percentile --      Diastolic BP Percentile --      Pulse Rate 01/24/23 1447 (!) 122     Resp 01/24/23 1447 17     Temp 01/24/23 1447 98 F (36.7 C)     Temp Source 01/24/23 1447 Oral     SpO2 01/24/23 1447 100 %     Weight 01/24/23 1448 147 lb 11.3 oz (67 kg)     Height 01/24/23 1448 6\' 1"  (1.854 m)     Head Circumference --      Peak Flow --      Pain Score 01/24/23 1448 0     Pain Loc --       Pain Education --      Exclude from Growth Chart --     Most recent vital signs: Vitals:   01/24/23 1556 01/24/23 1630  BP: (!) 171/116 (!) 140/84  Pulse: (!) 120 90  Resp: 18   Temp:    SpO2: 100% 100%     General: Alert, well-appearing, no distress.  CV:  Good peripheral perfusion.  Tachycardic, otherwise normal heart sounds. Resp:  Normal effort.  Lungs CTAB. Abd:  No distention.  Other:  5/5 motor strength and intact sensation to bilateral upper extremities.  Anterior and posterior cervical surgical scars clean/dry/intact, healing well.   ED Results / Procedures / Treatments   Labs (all labs ordered are listed, but only abnormal results are displayed) Labs Reviewed  CBC WITH DIFFERENTIAL/PLATELET - Abnormal; Notable for the following components:      Result Value   WBC 13.5 (*)    Hemoglobin 12.1 (*)    HCT 38.5 (*)    MCV 78.6 (*)    MCH 24.7 (*)  Platelets 415 (*)    Neutro Abs 11.1 (*)    All other components within normal limits  COMPREHENSIVE METABOLIC PANEL - Abnormal; Notable for the following components:   Sodium 133 (*)    Chloride 96 (*)    Glucose, Bld 151 (*)    Total Protein 9.2 (*)    Total Bilirubin 0.1 (*)    All other components within normal limits  CBG MONITORING, ED - Abnormal; Notable for the following components:   Glucose-Capillary 152 (*)    All other components within normal limits     EKG  ED ECG REPORT I, Dionne Bucy, the attending physician, personally viewed and interpreted this ECG.  Date: 01/24/2023 EKG Time: 1618 Rate: 94 Rhythm: normal sinus rhythm QRS Axis: normal Intervals: normal ST/T Wave abnormalities: normal Narrative Interpretation: no evidence of acute ischemia    RADIOLOGY     PROCEDURES:  Critical Care performed: No  Procedures   MEDICATIONS ORDERED IN ED: Medications  sodium chloride 0.9 % bolus 1,000 mL (0 mLs Intravenous Stopped 01/24/23 1800)     IMPRESSION / MDM /  ASSESSMENT AND PLAN / ED COURSE  I reviewed the triage vital signs and the nursing notes.  62 year old male with PMH as noted above and status post recent C-spine surgery presents primarily with a complaint of arm tremor that has now resolved.  In addition he has noted elevated blood pressure although has no symptoms associated with this.  He also feels he may be dehydrated.  On exam he is overall well-appearing.  He is tachycardic and hypertensive with otherwise normal vital signs.  His upper extremity neuro exam is normal and he does not demonstrate any tremor or other acute findings currently.  Surgical sites are healing well with no evidence of infection.  Differential diagnosis includes, but is not limited to, muscle spasm, cramping due to dehydration, electrolyte abnormality, radicular pain.  However, the patient has no acute or worsening pain or any evidence of other complication after the surgery.  Initial lab workup reveals mild leukocytosis, although the CBC overall appears hemoconcentrated.  Electrolytes are normal.  There is no evidence of rhabdomyolysis.  He has no AKI.  There is no evidence of acute infection or sepsis.  Patient's presentation is most consistent with acute complicated illness / injury requiring diagnostic workup.  We will give a fluid bolus and reassess.  If the blood pressure remains high he may need treatment for this.  ----------------------------------------- 6:45 PM on 01/24/2023 -----------------------------------------  Heart rate and blood pressure normalized.  The patient continues to be asymptomatic.  He is stable for discharge home.  I counseled him on the results of the workup.  I gave strict return precautions and he expresses understanding.   FINAL CLINICAL IMPRESSION(S) / ED DIAGNOSES   Final diagnoses:  Muscle tremor  Dehydration     Rx / DC Orders   ED Discharge Orders     None        Note:  This document was prepared using Dragon  voice recognition software and may include unintentional dictation errors.    Dionne Bucy, MD 01/24/23 [phone]

## 2023-01-24 NOTE — ED Triage Notes (Signed)
Pt presents to ED with c/o hypertension and states he has not taken meds for the that in the past 5 years, pt states "I got away from the stress".   Pt states recent neck surgery.

## 2023-01-24 NOTE — Discharge Instructions (Addendum)
Follow up with your primary care provider and surgeon as scheduled.  Return to the ER for new, worsening, or persistent severe tremor, weakness or numbness, pain, lightheadedness, elevated blood pressure readings, or any other new or worsening symptoms that concern you.

## 2023-03-21 ENCOUNTER — Other Ambulatory Visit: Payer: Self-pay

## 2023-03-21 ENCOUNTER — Emergency Department
Admission: EM | Admit: 2023-03-21 | Discharge: 2023-03-21 | Disposition: A | Discharge status: Home / Self Care | Payer: Medicaid Other | Attending: Emergency Medicine | Admitting: Emergency Medicine

## 2023-03-21 DIAGNOSIS — R1033 Periumbilical pain: Secondary | ICD-10-CM | POA: Insufficient documentation

## 2023-03-21 DIAGNOSIS — R112 Nausea with vomiting, unspecified: Secondary | ICD-10-CM | POA: Insufficient documentation

## 2023-03-21 DIAGNOSIS — I10 Essential (primary) hypertension: Secondary | ICD-10-CM | POA: Insufficient documentation

## 2023-03-21 DIAGNOSIS — E119 Type 2 diabetes mellitus without complications: Secondary | ICD-10-CM | POA: Diagnosis not present

## 2023-03-21 DIAGNOSIS — R1030 Lower abdominal pain, unspecified: Secondary | ICD-10-CM

## 2023-03-21 DIAGNOSIS — R1084 Generalized abdominal pain: Secondary | ICD-10-CM | POA: Diagnosis present

## 2023-03-21 LAB — COMPREHENSIVE METABOLIC PANEL
ALT: 13 U/L (ref 0–44)
AST: 20 U/L (ref 15–41)
Albumin: 4.5 g/dL (ref 3.5–5.0)
Alkaline Phosphatase: 60 U/L (ref 38–126)
Anion gap: 10 (ref 5–15)
BUN: 17 mg/dL (ref 8–23)
CO2: 26 mmol/L (ref 22–32)
Calcium: 9.6 mg/dL (ref 8.9–10.3)
Chloride: 103 mmol/L (ref 98–111)
Creatinine, Ser: 1.05 mg/dL (ref 0.61–1.24)
GFR, Estimated: >60 mL/min (ref >60)
Glucose, Bld: 168 mg/dL — ABNORMAL HIGH (ref 70–99)
Potassium: 4.5 mmol/L (ref 3.5–5.1)
Sodium: 139 mmol/L (ref 135–145)
Total Bilirubin: 0.8 mg/dL (ref 0.3–1.2)
Total Protein: 8.3 g/dL — ABNORMAL HIGH (ref 6.5–8.1)

## 2023-03-21 LAB — CBC
HCT: 39 % (ref 39.0–52.0)
Hemoglobin: 12.3 g/dL — ABNORMAL LOW (ref 13.0–17.0)
MCH: 24.6 pg — ABNORMAL LOW (ref 26.0–34.0)
MCHC: 31.5 g/dL (ref 30.0–36.0)
MCV: 77.8 fL — ABNORMAL LOW (ref 80.0–100.0)
Platelets: 248 10*3/uL (ref 150–400)
RBC: 5.01 MIL/uL (ref 4.22–5.81)
RDW: 13.6 % (ref 11.5–15.5)
WBC: 9.4 10*3/uL (ref 4.0–10.5)
nRBC: 0 % (ref 0.0–0.2)

## 2023-03-21 LAB — URINALYSIS, ROUTINE W REFLEX MICROSCOPIC
Bacteria, UA: NONE SEEN
Bilirubin Urine: NEGATIVE
Glucose, UA: >=500 mg/dL — AB
Hgb urine dipstick: NEGATIVE
Ketones, ur: 20 mg/dL — AB
Leukocytes,Ua: NEGATIVE
Nitrite: NEGATIVE
Protein, ur: NEGATIVE mg/dL
RBC / HPF: 0 RBC/hpf (ref 0–5)
Specific Gravity, Urine: 1.022 (ref 1.005–1.030)
Squamous Epithelial / HPF: 0 /[HPF] (ref 0–5)
pH: 7 (ref 5.0–8.0)

## 2023-03-21 LAB — LIPASE, BLOOD: Lipase: 21 U/L (ref 11–51)

## 2023-03-21 MED ORDER — ONDANSETRON HCL 4 MG/2ML IJ SOLN
4.0000 mg | Freq: Once | INTRAMUSCULAR | Status: AC | PRN
  Administered 2023-03-21: 4 mg via INTRAVENOUS
  Filled 2023-03-21: qty 2

## 2023-03-21 MED ORDER — ONDANSETRON 8 MG PO TBDP: 8.0000 mg | ORAL_TABLET | Freq: Three times a day (TID) | ORAL | 0 refills | Status: AC | PRN

## 2023-03-21 MED ORDER — ACETAMINOPHEN 500 MG PO TABS
1000.0000 mg | ORAL_TABLET | Freq: Once | ORAL | Status: AC
  Administered 2023-03-21: 1000 mg via ORAL
  Filled 2023-03-21: qty 2

## 2023-03-21 MED ORDER — ONDANSETRON HCL 4 MG/2ML IJ SOLN
4.0000 mg | Freq: Once | INTRAMUSCULAR | Status: AC
  Administered 2023-03-21: 4 mg via INTRAVENOUS
  Filled 2023-03-21: qty 2

## 2023-03-21 NOTE — ED Notes (Signed)
This RN assisted patient to use toilet and collect urine sample.

## 2023-03-21 NOTE — ED Notes (Signed)
Pt provided discharge instructions and prescription information. Pt was given the opportunity to ask questions and questions were answered.   

## 2023-03-21 NOTE — Discharge Instructions (Addendum)
Please use ibuprofen (Motrin) up to 800 mg every 8 hours, naproxen (Naprosyn) up to 500 mg every 12 hours, and/or acetaminophen (Tylenol) up to 4 g/day for any continued pain.  Please do not use this medication regimen for longer than 7 days

## 2023-03-21 NOTE — ED Triage Notes (Signed)
Pt here with generalized abd pain since this morning. Pt states pain in severe and he endorses NV. Pt states vomit is green in color.

## 2023-03-21 NOTE — ED Provider Notes (Signed)
[facility] Provider Note   Event Date/Time   First MD Initiated Contact with Patient 03/21/23 1405     (approximate) History  Abdominal Pain  HPI Arthur Huff is a 62 y.o. male with a past medical history of hypertension and type 2 diabetes who presents complaining of generalized abdominal pain since this morning.  Patient states that this pain is in bilateral lower quadrants and 10/10 in severity.  Patient also endorses nausea/vomiting without diarrhea.  Patient denies any recent travel or sick contacts ROS: Patient currently denies any vision changes, tinnitus, difficulty speaking, facial droop, sore throat, chest pain, shortness of breath, diarrhea, dysuria, or weakness/numbness/paresthesias in any extremity   Physical Exam  Triage Vital Signs: ED Triage Vitals [03/21/23 1334]  Encounter Vitals Group     BP (!) 147/82     Systolic BP Percentile      Diastolic BP Percentile      Pulse Rate 67     Resp 18     Temp 98.2 F (36.8 C)     Temp Source Oral     SpO2 100 %     Weight 147 lb 11.3 oz (67 kg)     Height 6\' 1"  (1.854 m)     Head Circumference      Peak Flow      Pain Score 10     Pain Loc      Pain Education      Exclude from Growth Chart    Most recent vital signs: Vitals:   03/21/23 1334  BP: (!) 147/82  Pulse: 67  Resp: 18  Temp: 98.2 F (36.8 C)  SpO2: 100%   General: Awake, oriented x4. CV:  Good peripheral perfusion.  Resp:  Normal effort.  Abd:  No distention.  Periumbilical tenderness to palpation Other:  Middle-aged well-developed, well-nourished African-American male resting comfortably in no acute distress ED Results / Procedures / Treatments  Labs (all labs ordered are listed, but only abnormal results are displayed) Labs Reviewed  COMPREHENSIVE METABOLIC PANEL - Abnormal; Notable for the following components:      Result Value   Glucose, Bld 168 (*)    Total Protein 8.3 (*)    All other components within normal  limits  CBC - Abnormal; Notable for the following components:   Hemoglobin 12.3 (*)    MCV 77.8 (*)    MCH 24.6 (*)    All other components within normal limits  LIPASE, BLOOD  URINALYSIS, ROUTINE W REFLEX MICROSCOPIC   EKG ED ECG REPORT I, Merwyn Katos, the attending physician, personally viewed and interpreted this ECG. Date: 03/21/2023 EKG Time: 1342 Rate: 74 Rhythm: normal sinus rhythm QRS Axis: normal Intervals: normal ST/T Wave abnormalities: normal Narrative Interpretation: no evidence of acute ischemia PROCEDURES: Critical Care performed: No .1-3 Lead EKG Interpretation  Performed by: Merwyn Katos, MD Authorized by: Merwyn Katos, MD     Interpretation: normal     ECG rate:  71   ECG rate assessment: normal     Rhythm: sinus rhythm     Ectopy: none     Conduction: normal    MEDICATIONS ORDERED IN ED: Medications  ondansetron (ZOFRAN) injection 4 mg (4 mg Intravenous Given 03/21/23 1341)  ondansetron (ZOFRAN) injection 4 mg (4 mg Intravenous Given 03/21/23 1420)   IMPRESSION / MDM / ASSESSMENT AND PLAN / ED COURSE  I reviewed the triage vital signs and the nursing notes.  The patient is on the cardiac monitor to evaluate for evidence of arrhythmia and/or significant heart rate changes. Patient's presentation is most consistent with acute presentation with potential threat to life or bodily function. Patient presents for abdominal pain.  Differential diagnosis includes appendicitis, abdominal aortic aneurysm, surgical biliary disease, pancreatitis, SBO, mesenteric ischemia, serious intra-abdominal bacterial illness, genital torsion. Doubt atypical ACS. Based on history, physical exam, radiologic/laboratory evaluation, there is no red flag results or symptomatology requiring emergent intervention or need for admission at this time Pt tolerating PO. Disposition: Patient will be discharged with strict return precautions and follow  up with primary MD within 12-24 hours for further evaluation. Patient understands that this still may have an early presentation of an emergent medical condition such as appendicitis that will require a recheck.    FINAL CLINICAL IMPRESSION(S) / ED DIAGNOSES   Final diagnoses:  Lower abdominal pain  Nausea and vomiting, unspecified vomiting type   Rx / DC Orders   ED Discharge Orders          Ordered    ondansetron (ZOFRAN-ODT) 8 MG disintegrating tablet  Every 8 hours PRN        03/21/23 1504           Note:  This document was prepared using Dragon voice recognition software and may include unintentional dictation errors.   Merwyn Katos, MD 03/21/23 [phone]

## 2023-03-24 ENCOUNTER — Emergency Department
Admission: EM | Admit: 2023-03-24 | Discharge: 2023-03-24 | Disposition: A | Discharge status: Home / Self Care | Payer: Medicaid Other | Attending: Emergency Medicine | Admitting: Emergency Medicine

## 2023-03-24 ENCOUNTER — Emergency Department: Payer: Medicaid Other

## 2023-03-24 ENCOUNTER — Ambulatory Visit
Admission: EM | Admit: 2023-03-24 | Discharge: 2023-03-24 | Disposition: A | Discharge status: Home / Self Care | Payer: Medicaid Other | Attending: Emergency Medicine | Admitting: Emergency Medicine

## 2023-03-24 ENCOUNTER — Other Ambulatory Visit: Payer: Self-pay

## 2023-03-24 DIAGNOSIS — R112 Nausea with vomiting, unspecified: Secondary | ICD-10-CM

## 2023-03-24 DIAGNOSIS — E1165 Type 2 diabetes mellitus with hyperglycemia: Secondary | ICD-10-CM | POA: Diagnosis not present

## 2023-03-24 DIAGNOSIS — R1010 Upper abdominal pain, unspecified: Secondary | ICD-10-CM

## 2023-03-24 DIAGNOSIS — R1013 Epigastric pain: Secondary | ICD-10-CM | POA: Insufficient documentation

## 2023-03-24 DIAGNOSIS — I10 Essential (primary) hypertension: Secondary | ICD-10-CM

## 2023-03-24 DIAGNOSIS — E119 Type 2 diabetes mellitus without complications: Secondary | ICD-10-CM | POA: Insufficient documentation

## 2023-03-24 DIAGNOSIS — M25511 Pain in right shoulder: Secondary | ICD-10-CM

## 2023-03-24 DIAGNOSIS — R1030 Lower abdominal pain, unspecified: Secondary | ICD-10-CM | POA: Diagnosis not present

## 2023-03-24 LAB — CBC
HCT: 40.2 % (ref 39.0–52.0)
Hemoglobin: 13.1 g/dL (ref 13.0–17.0)
MCH: 24.7 pg — ABNORMAL LOW (ref 26.0–34.0)
MCHC: 32.6 g/dL (ref 30.0–36.0)
MCV: 75.7 fL — ABNORMAL LOW (ref 80.0–100.0)
Platelets: 246 10*3/uL (ref 150–400)
RBC: 5.31 MIL/uL (ref 4.22–5.81)
RDW: 13 % (ref 11.5–15.5)
WBC: 7.2 10*3/uL (ref 4.0–10.5)
nRBC: 0 % (ref 0.0–0.2)

## 2023-03-24 LAB — COMPREHENSIVE METABOLIC PANEL
ALT: 18 U/L (ref 0–44)
AST: 23 U/L (ref 15–41)
Albumin: 4.3 g/dL (ref 3.5–5.0)
Alkaline Phosphatase: 61 U/L (ref 38–126)
Anion gap: 11 (ref 5–15)
BUN: 15 mg/dL (ref 8–23)
CO2: 30 mmol/L (ref 22–32)
Calcium: 9.2 mg/dL (ref 8.9–10.3)
Chloride: 92 mmol/L — ABNORMAL LOW (ref 98–111)
Creatinine, Ser: 0.97 mg/dL (ref 0.61–1.24)
GFR, Estimated: >60 mL/min (ref >60)
Glucose, Bld: 178 mg/dL — ABNORMAL HIGH (ref 70–99)
Potassium: 3.7 mmol/L (ref 3.5–5.1)
Sodium: 133 mmol/L — ABNORMAL LOW (ref 135–145)
Total Bilirubin: 0.9 mg/dL (ref 0.3–1.2)
Total Protein: 8.1 g/dL (ref 6.5–8.1)

## 2023-03-24 LAB — POCT FASTING CBG KUC MANUAL ENTRY: POCT Glucose (KUC): 175 mg/dL — AB (ref 70–99)

## 2023-03-24 LAB — URINALYSIS, ROUTINE W REFLEX MICROSCOPIC
Bilirubin Urine: NEGATIVE
Glucose, UA: >=500 mg/dL — AB
Hgb urine dipstick: NEGATIVE
Ketones, ur: 20 mg/dL — AB
Leukocytes,Ua: NEGATIVE
Nitrite: NEGATIVE
Protein, ur: NEGATIVE mg/dL
Specific Gravity, Urine: 1.02 (ref 1.005–1.030)
Squamous Epithelial / HPF: 0 /[HPF] (ref 0–5)
pH: 8 (ref 5.0–8.0)

## 2023-03-24 LAB — LIPASE, BLOOD: Lipase: 23 U/L (ref 11–51)

## 2023-03-24 MED ORDER — SUCRALFATE 1 G PO TABS: 1.0000 g | ORAL_TABLET | Freq: Four times a day (QID) | ORAL | 0 refills | Status: AC

## 2023-03-24 MED ORDER — PANTOPRAZOLE SODIUM 40 MG IV SOLR
40.0000 mg | Freq: Once | INTRAVENOUS | Status: AC
  Administered 2023-03-24: 40 mg via INTRAVENOUS
  Filled 2023-03-24: qty 10

## 2023-03-24 MED ORDER — IOHEXOL 300 MG/ML  SOLN
100.0000 mL | Freq: Once | INTRAMUSCULAR | Status: AC | PRN
  Administered 2023-03-24: 100 mL via INTRAVENOUS

## 2023-03-24 MED ORDER — SODIUM CHLORIDE 0.9 % IV BOLUS
1000.0000 mL | Freq: Once | INTRAVENOUS | Status: AC
  Administered 2023-03-24: 1000 mL via INTRAVENOUS

## 2023-03-24 MED ORDER — MORPHINE SULFATE (PF) 4 MG/ML IV SOLN
4.0000 mg | Freq: Once | INTRAVENOUS | Status: AC
  Administered 2023-03-24: 4 mg via INTRAVENOUS
  Filled 2023-03-24: qty 1

## 2023-03-24 MED ORDER — PANTOPRAZOLE SODIUM 40 MG PO TBEC: 40.0000 mg | DELAYED_RELEASE_TABLET | Freq: Every day | ORAL | 1 refills | Status: AC

## 2023-03-24 MED ORDER — ONDANSETRON HCL 4 MG/2ML IJ SOLN
4.0000 mg | Freq: Once | INTRAMUSCULAR | Status: AC
  Administered 2023-03-24: 4 mg via INTRAVENOUS
  Filled 2023-03-24: qty 2

## 2023-03-24 MED ORDER — FAMOTIDINE IN NACL 20-0.9 MG/50ML-% IV SOLN: 20.0000 mg | Freq: Once | INTRAVENOUS | Status: DC

## 2023-03-24 MED ORDER — DOCUSATE SODIUM 100 MG PO CAPS: 100.0000 mg | ORAL_CAPSULE | Freq: Two times a day (BID) | ORAL | 2 refills | Status: AC

## 2023-03-24 NOTE — ED Triage Notes (Signed)
Pt to ED for for continued abd pain for over a week. States sent from [facility] for imaging. NAD noted Also reports continued right arm pain from surgery in August for arthrodesis

## 2023-03-24 NOTE — Discharge Instructions (Addendum)
Please take your medications as prescribed.  Please call the number for GI medicine to arrange a follow-up appointment to discuss further intervention such as an endoscopy should you continue to have pain.  Please avoid alcohol and NSAIDs such as ibuprofen/Aleve/Motrin.  Return to the emergency department for any worsening pain or any other symptom personally concerning to yourself.

## 2023-03-24 NOTE — Discharge Instructions (Signed)
° °  Go to the emergency department for evaluation of your abdominal pain.

## 2023-03-24 NOTE — ED Provider Notes (Signed)
[facility] Provider Note    Event Date/Time   First MD Initiated Contact with Patient 03/24/23 1251     (approximate)   History   Abdominal Pain   HPI  Arthur Huff is a 62 y.o. male history of hypertension, diabetes presents emergency department with abdominal pain for over a week.  Went to urgent care and was sent here for imaging.  Was seen last week with the same type pain.  Patient states is hurting in the epigastric area.  No dark stools.  No vomiting.  Does still have a gallbladder and appendix.  No history of a bowel obstruction.  However states he has not had a bowel movement in about a week.      Physical Exam   Triage Vital Signs: ED Triage Vitals  Encounter Vitals Group     BP 03/24/23 1211 (!) 159/96     Systolic BP Percentile --      Diastolic BP Percentile --      Pulse Rate 03/24/23 1211 81     Resp 03/24/23 1211 18     Temp 03/24/23 1211 98 F (36.7 C)     Temp src --      SpO2 03/24/23 1211 100 %     Weight 03/24/23 1212 147 lb 11.3 oz (67 kg)     Height 03/24/23 1212 6\' 1"  (1.854 m)     Head Circumference --      Peak Flow --      Pain Score 03/24/23 1212 10     Pain Loc --      Pain Education --      Exclude from Growth Chart --     Most recent vital signs: Vitals:   03/24/23 1211  BP: (!) 159/96  Pulse: 81  Resp: 18  Temp: 98 F (36.7 C)  SpO2: 100%     General: Awake, no distress.  Patient appears to be a little pale CV:  Good peripheral perfusion. regular rate and  rhythm Resp:  Normal effort. Lungs cta Abd:  No distention.  Nontender Other:      ED Results / Procedures / Treatments   Labs (all labs ordered are listed, but only abnormal results are displayed) Labs Reviewed  COMPREHENSIVE METABOLIC PANEL - Abnormal; Notable for the following components:      Result Value   Sodium 133 (*)    Chloride 92 (*)    Glucose, Bld 178 (*)    All other components within normal limits  CBC - Abnormal;  Notable for the following components:   MCV 75.7 (*)    MCH 24.7 (*)    All other components within normal limits  LIPASE, BLOOD  URINALYSIS, ROUTINE W REFLEX MICROSCOPIC     EKG     RADIOLOGY CT abdomen pelvis IV contrast    PROCEDURES:   Procedures   MEDICATIONS ORDERED IN ED: Medications  morphine (PF) 4 MG/ML injection 4 mg (has no administration in time range)  ondansetron (ZOFRAN) injection 4 mg (has no administration in time range)  sodium chloride 0.9 % bolus 1,000 mL (0 mLs Intravenous Stopped 03/24/23 1543)  pantoprazole (PROTONIX) injection 40 mg (40 mg Intravenous Given 03/24/23 1353)  iohexol (OMNIPAQUE) 300 MG/ML solution 100 mL (100 mLs Intravenous Contrast Given 03/24/23 1422)     IMPRESSION / MDM / ASSESSMENT AND PLAN / ED COURSE  I reviewed the triage vital signs and the nursing notes.  Differential diagnosis includes, but is not limited to, PUD, pancreatitis, acute cholecystitis, UTI, bowel obstruction, appendicitis  Patient's presentation is most consistent with acute illness / injury with system symptoms.   CBC metabolic panel and lipase are reassuring  CT abdomen pelvis IV contrast  Medications given: Protonix, 1 L normal saline IV, morphine 4 mL IV, Zofran 4 mg IV  Care being transferred to [facility], PA-C, plan is to await bladder scan, UA, CT results.      FINAL CLINICAL IMPRESSION(S) / ED DIAGNOSES   Final diagnoses:  Pain of upper abdomen     Rx / DC Orders   ED Discharge Orders     None        Note:  This document was prepared using Dragon voice recognition software and may include unintentional dictation errors.    Faythe Ghee, PA-C 03/24/23 1549    Chesley Noon, MD 03/26/23 [phone]

## 2023-03-24 NOTE — ED Provider Notes (Signed)
Renaldo Fiddler    CSN: [id number] Arrival date & time: 03/24/23  1105      History   Chief Complaint Chief Complaint  Patient presents with   Abdominal Pain   Shoulder Pain    HPI Talis A Polak is a 62 y.o. male.  Patient presents with 1 week history of constant, aching, 10/10 abdominal pain, primarily in his lower abdomen.  He has associated nausea and vomiting.  2 episodes of emesis today.  He denies fever, diarrhea, dysuria.  Patient also presents with right shoulder pain due to previous injury.  He reports intermittent episodes of pain since the injury in August.  No chest pain, shortness of breath, focal weakness, or other symptoms.  Patient was seen at [facility] ED on 03/21/2023; diagnosed with lower abdominal pain, nausea and vomiting.  His medical history includes diabetes and hypertension.  Last A1c 7.0 on 12/24/2022.  The history is provided by the patient and medical records.    Past Medical History:  Diagnosis Date   Diabetes mellitus without complication (HCC)    Hypertension     There are no problems to display for this patient.   Past Surgical History:  Procedure Laterality Date   CERVICAL FUSION         Home Medications    Prior to Admission medications   Medication Sig Start Date End Date Taking? Authorizing Provider  cyclobenzaprine (FLEXERIL) 5 MG tablet 1 tablet every 8 hours as he did for muscle spasms 04/25/21   Irean Hong, MD  glipiZIDE (GLUCOTROL) 10 MG tablet Take 10 mg by mouth daily before breakfast.    [provider]  lisinopril (PRINIVIL,ZESTRIL) 40 MG tablet Take 40 mg by mouth daily.    [provider]  naproxen (NAPROSYN) 500 MG tablet Take 1 tablet (500 mg total) by mouth 2 (two) times daily with a meal. 04/25/21   Irean Hong, MD  ondansetron (ZOFRAN-ODT) 8 MG disintegrating tablet Take 1 tablet (8 mg total) by mouth every 8 (eight) hours as needed for nausea or vomiting. 03/21/23   Merwyn Katos, MD     Family History History reviewed. No pertinent family history.  Social History Social History   Tobacco Use   Smoking status: Every Day   Smokeless tobacco: Never  Substance Use Topics   Alcohol use: Yes   Drug use: Yes    Types: Marijuana    Comment: every now and then     Allergies   Patient has no known allergies.   Review of Systems Review of Systems  Constitutional:  Negative for chills and fever.  Respiratory:  Negative for cough and shortness of breath.   Cardiovascular:  Negative for chest pain and palpitations.  Gastrointestinal:  Positive for abdominal pain, nausea and vomiting. Negative for diarrhea.  Musculoskeletal:  Positive for arthralgias. Negative for joint swelling.  Neurological:  Negative for weakness and numbness.     Physical Exam Triage Vital Signs ED Triage Vitals  Encounter Vitals Group     BP      Systolic BP Percentile      Diastolic BP Percentile      Pulse      Resp      Temp      Temp src      SpO2      Weight      Height      Head Circumference      Peak Flow      Pain  Score      Pain Loc      Pain Education      Exclude from Growth Chart    No data found.  Updated Vital Signs BP (!) 176/98   Pulse 78   Temp 97.6 F (36.4 C)   Resp 18   SpO2 100%   Visual Acuity Right Eye Distance:   Left Eye Distance:   Bilateral Distance:    Right Eye Near:   Left Eye Near:    Bilateral Near:     Physical Exam Constitutional:      General: He is not in acute distress.    Appearance: He is ill-appearing.  HENT:     Mouth/Throat:     Mouth: Mucous membranes are moist.  Cardiovascular:     Rate and Rhythm: Normal rate and regular rhythm.     Heart sounds: Normal heart sounds.  Pulmonary:     Effort: Pulmonary effort is normal. No respiratory distress.     Breath sounds: Normal breath sounds.  Abdominal:     Palpations: Abdomen is soft.     Tenderness: There is generalized abdominal tenderness. There is no  guarding or rebound.  Musculoskeletal:        General: Tenderness present. No swelling or deformity.     Comments: Tenderness to palpation of right shoulder with decreased ROM due to discomfort.   Skin:    General: Skin is warm and dry.  Neurological:     General: No focal deficit present.     Mental Status: He is alert and oriented to person, place, and time.     Sensory: No sensory deficit.     Motor: No weakness.      UC Treatments / Results  Labs (all labs ordered are listed, but only abnormal results are displayed) Labs Reviewed  POCT FASTING CBG [facility] MANUAL ENTRY - Abnormal; Notable for the following components:      Result Value   POCT Glucose ([facility]) 175 (*)    All other components within normal limits    EKG   Radiology No results found.  Procedures Procedures (including critical care time)  Medications Ordered in UC Medications - No data to display  Initial Impression / Assessment and Plan / UC Course  I have reviewed the triage vital signs and the nursing notes.  Pertinent labs & imaging results that were available during my care of the patient were reviewed by me and considered in my medical decision making (see chart for details).    Lower abdominal pain, nausea and vomiting, right shoulder pain, elevated blood pressure with hypertension, hyperglycemia with type 2 diabetes.  Patient has 10/10 abdominal pain.  Sending him to the ED for evaluation.  He is agreeable to this.  He declines EMS.  He drove himself here and feels stable to drive himself to the ED.  Final Clinical Impressions(s) / UC Diagnoses   Final diagnoses:  Lower abdominal pain  Nausea and vomiting, unspecified vomiting type  Right shoulder pain, unspecified chronicity  Elevated blood pressure reading in office with diagnosis of hypertension  Type 2 diabetes mellitus with hyperglycemia, without long-term current use of insulin [facility])     Discharge Instructions      Go to the emergency  department for evaluation of your abdominal pain.       ED Prescriptions   None    PDMP not reviewed this encounter.   Mickie Bail, NP 03/24/23 1147

## 2023-03-24 NOTE — ED Provider Notes (Signed)
-----------------------------------------   5:34 PM on 03/24/2023 ----------------------------------------- Patient care assumed from Greig Right.  Patient CT scan is essentially negative.  No acute process.  I personally reviewed the CT exam as well on my evaluation I do not see any obstruction.  Lab work is reassuring occluding normal CBC chemistry lipase and urinalysis.  I have personally seen and evaluated the patient he still complaining of some mild epigastric discomfort.  Symptoms sound very suggestive of gastritis.  Patient is already on chronic pain medication his abdominal discomfort could be constipation related as well.  Will place patient on Colace twice daily, Protonix each morning for the next 2 months and sucralfate 4 times daily x 2 weeks.  I will refer the patient to GI medicine for further evaluation and consideration of possible endoscopy should he continue to have abdominal pain.  Patient agreeable to plan of care.   Minna Antis, MD 03/24/23 1739

## 2023-03-24 NOTE — ED Triage Notes (Signed)
Patient to Urgent Care with complaints of lower abdominal pain. Describes a constant aching pain. Some nausea and vomiting. Denies any diarrhea. Denies any fevers/ urinary symptoms. Visibly uncomfortable during triage.  Symptoms started 1 week ago. Seen at the ER on Friday.   Also w/ complaints of right sided shoulder pain. Reports hx of injury- surgery August 19th. Reports intermittent pain since. Difficulty to raise arm.

## 2023-06-11 ENCOUNTER — Ambulatory Visit
Admission: EM | Admit: 2023-06-11 | Discharge: 2023-06-11 | Discharge status: Against Medical Advice | Payer: Medicaid Other

## 2023-06-11 NOTE — ED Notes (Signed)
 Pt states he is leaving and has an appt elsewhere.
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
## 2024-07-14 ENCOUNTER — Ambulatory Visit
